# Patient Record
Sex: Male | Born: 1990 | Race: Black or African American | Hispanic: No | Marital: Single | Smoking: Current every day smoker
Health system: Southern US, Community
[De-identification: ages and names within clinical notes are randomized; demographics above are authoritative.]

## PROBLEM LIST (undated history)

## (undated) DIAGNOSIS — B20 Human immunodeficiency virus [HIV] disease: Secondary | ICD-10-CM

## (undated) DIAGNOSIS — Z21 Asymptomatic human immunodeficiency virus [HIV] infection status: Secondary | ICD-10-CM

---
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
## 2009-03-04 ENCOUNTER — Emergency Department: Payer: Self-pay

## 2012-01-19 ENCOUNTER — Emergency Department: Payer: Self-pay | Admitting: Emergency Medicine

## 2012-03-27 ENCOUNTER — Emergency Department: Payer: Self-pay | Admitting: Emergency Medicine

## 2013-05-09 ENCOUNTER — Emergency Department: Payer: Self-pay | Admitting: Emergency Medicine

## 2015-06-13 ENCOUNTER — Emergency Department
Admission: EM | Admit: 2015-06-13 | Discharge: 2015-06-13 | Disposition: A | Discharge status: Home / Self Care | Payer: Self-pay | Attending: Emergency Medicine | Admitting: Emergency Medicine

## 2015-06-13 ENCOUNTER — Encounter: Payer: Self-pay | Admitting: Emergency Medicine

## 2015-06-13 DIAGNOSIS — J039 Acute tonsillitis, unspecified: Secondary | ICD-10-CM | POA: Insufficient documentation

## 2015-06-13 DIAGNOSIS — F17200 Nicotine dependence, unspecified, uncomplicated: Secondary | ICD-10-CM | POA: Insufficient documentation

## 2015-06-13 LAB — POCT RAPID STREP A: Streptococcus, Group A Screen (Direct): NEGATIVE

## 2015-06-13 MED ORDER — DEXAMETHASONE SODIUM PHOSPHATE 10 MG/ML IJ SOLN
10.0000 mg | Freq: Once | INTRAMUSCULAR | Status: AC
  Administered 2015-06-13: 10 mg via INTRAMUSCULAR
  Filled 2015-06-13: qty 1

## 2015-06-13 MED ORDER — MAGIC MOUTHWASH W/LIDOCAINE: 5.0000 mL | Freq: Four times a day (QID) | ORAL | Status: DC | PRN

## 2015-06-13 MED ORDER — PENICILLIN G BENZATHINE 1200000 UNIT/2ML IM SUSP
2.4000 10*6.[IU] | Freq: Once | INTRAMUSCULAR | Status: AC
  Administered 2015-06-13: 2.4 10*6.[IU] via INTRAMUSCULAR
  Filled 2015-06-13: qty 4

## 2015-06-13 NOTE — ED Provider Notes (Signed)
[facility] Emergency Department Provider Note  ____________________________________________  Time seen: Approximately 9:33 PM  I have reviewed the triage vital signs and the nursing notes.   HISTORY  Chief Complaint Sore Throat    HPI Leonard Moon is a 25 y.o. male , NAD, presents to the emergency department with a several-day history of sore throat. States he's had strep throats in the past and this feels similar. States it hurts to swallow. Has taken some Tylenol earlier today with no alleviation of pain. Denies any sick contacts. Has not had any fevers, chills, body aches. Does feel fatigued. Denies any nasal congestion, runny nose, sneezing, ear pain, chest pain, shortness breath, abdominal pain, nausea, vomiting, rash.   History reviewed. No pertinent past medical history.  There are no active problems to display for this patient.   History reviewed. No pertinent past surgical history.  Current Outpatient Rx  Name  Route  Sig  Dispense  Refill  . magic mouthwash w/lidocaine SOLN   Oral   Take 5 mLs by mouth 4 (four) times daily as needed for mouth pain.   240 mL   0     Please mix 80mL diphenhydramine, 80mL nystatin, 80 ...     Allergies Review of patient's allergies indicates no known allergies.  History reviewed. No pertinent family history.  Social History Social History  Substance Use Topics  . Smoking status: Heavy Tobacco Smoker  . Smokeless tobacco: None  . Alcohol Use: Yes     Review of Systems  Constitutional: Positive fatigue. No fever/chills Eyes: No visual changes. No discharge, redness, swelling ENT: Positive sore throat. No nasal congestion, runny nose, ear pain, sinus pressure. Cardiovascular: No chest pain. Respiratory: No cough or chest congestion. No shortness of breath. No wheezing.  Gastrointestinal: No abdominal pain.  No nausea, vomiting.  No diarrhea.  Musculoskeletal: Negative for general myalgias.   Skin: Negative for rash. Neurological: Negative for headaches, focal weakness or numbness. 10-point ROS otherwise negative.  ____________________________________________   PHYSICAL EXAM:  VITAL SIGNS: ED Triage Vitals  Enc Vitals Group     BP 06/13/15 2104 141/72 mmHg     Pulse Rate 06/13/15 2104 71     Resp 06/13/15 2104 18     Temp 06/13/15 2104 98.6 F (37 C)     Temp Source 06/13/15 2104 Oral     SpO2 06/13/15 2104 99 %     Weight 06/13/15 2104 165 lb (74.844 kg)     Height 06/13/15 2104  (1.676 m)     Head Cir --      Peak Flow --      Pain Score 06/13/15 2105 8     Pain Loc --      Pain Edu? --      Excl. in GC? --      Constitutional: Alert and oriented. Well appearing and in no acute distress. Eyes: Conjunctivae are normal. PERRL. EOMI without pain.  Head: Atraumatic. ENT:      Ears: TMs visualized bilaterally without effusion, erythema, bulging, perforation.      Nose: No congestion/rhinnorhea.      Mouth/Throat: Pharynx with moderate erythema and tonsils with yellow exudate and mild swelling. No posterior pharyngeal swelling. Uvula is midline. Mucous membranes are moist.  Neck: No stridor. Supple with full range of motion. Hematological/Lymphatic/Immunilogical: No cervical lymphadenopathy. Cardiovascular: Normal rate, regular rhythm. Normal S1 and S2.  Good peripheral circulation. Respiratory: Normal respiratory effort without tachypnea or retractions. Lungs CTAB  with breath sounds noted in all lung fields. Neurologic:  Normal speech and language. No gross focal neurologic deficits are appreciated.  Skin:  Skin is warm, dry and intact. No rash noted. Psychiatric: Mood and affect are normal. Speech and behavior are normal. Patient exhibits appropriate insight and judgement.   ____________________________________________   LABS (all labs ordered are listed, but only abnormal results are displayed)  Labs Reviewed  CULTURE, GROUP A STREP [facility])  POCT  RAPID STREP A   ____________________________________________  EKG  None ____________________________________________  RADIOLOGY  None ____________________________________________    PROCEDURES  Procedure(s) performed: None    Medications  penicillin g benzathine (BICILLIN LA) 1200000 UNIT/2ML injection 2.4 Million Units (2.4 Million Units Intramuscular Given 06/13/15 2217)  dexamethasone (DECADRON) injection 10 mg (10 mg Intramuscular Given 06/13/15 2218)     ____________________________________________   INITIAL IMPRESSION / ASSESSMENT AND PLAN / ED COURSE  Pertinent lab results that were available during my care of the patient were reviewed by me and considered in my medical decision making (see chart for details).  Patient's diagnosis is consistent with acute tonsillitis. Patient was treated with 2.4 million units of Bicillin LA and 10 mg of Decadron IM. Patient will be discharged home with instructions for symptomatic care at home. Patient [facility] to [facility] if symptoms persist past this treatment course. Patient is given ED precautions to return to the ED for any worsening or new symptoms.    ____________________________________________  FINAL CLINICAL IMPRESSION(S) / ED DIAGNOSES  Final diagnoses:  Tonsillitis      NEW MEDICATIONS STARTED DURING THIS VISIT:  New Prescriptions   MAGIC MOUTHWASH W/LIDOCAINE SOLN    Take 5 mLs by mouth 4 (four) times daily as needed for mouth pain.         Hope PigeonJami L Hagler, PA-C 06/13/15 2239  Rockne MenghiniAnne-Caroline Norman, MD 06/14/15 [phone]

## 2015-06-13 NOTE — ED Notes (Signed)
Pt arrived to the ED for complaints of a sore throat and not feeling well. Pt states that he has had strep throat before and this feels like it. Pt is AOx4 in no apparent distress.

## 2015-06-13 NOTE — ED Notes (Signed)
Pt left without his discharge paper.

## 2015-06-13 NOTE — Discharge Instructions (Signed)

## 2015-06-16 LAB — CULTURE, GROUP A STREP (THRC)

## 2016-05-09 ENCOUNTER — Other Ambulatory Visit: Payer: Self-pay | Admitting: *Deleted

## 2016-05-09 DIAGNOSIS — B20 Human immunodeficiency virus [HIV] disease: Secondary | ICD-10-CM

## 2016-05-09 MED ORDER — ABACAVIR-DOLUTEGRAVIR-LAMIVUD 600-50-300 MG PO TABS: 1.0000 | ORAL_TABLET | Freq: Every day | ORAL | 5 refills | Status: DC

## 2016-05-16 ENCOUNTER — Encounter: Payer: Self-pay | Admitting: Internal Medicine

## 2016-05-21 ENCOUNTER — Ambulatory Visit (INDEPENDENT_AMBULATORY_CARE_PROVIDER_SITE_OTHER): Payer: Self-pay | Admitting: Internal Medicine

## 2016-05-21 ENCOUNTER — Encounter: Payer: Self-pay | Admitting: Internal Medicine

## 2016-05-21 DIAGNOSIS — Z7185 Encounter for immunization safety counseling: Secondary | ICD-10-CM

## 2016-05-21 DIAGNOSIS — Z79899 Other long term (current) drug therapy: Secondary | ICD-10-CM

## 2016-05-21 DIAGNOSIS — Z7189 Other specified counseling: Secondary | ICD-10-CM

## 2016-05-21 DIAGNOSIS — Z23 Encounter for immunization: Secondary | ICD-10-CM

## 2016-05-21 DIAGNOSIS — Z113 Encounter for screening for infections with a predominantly sexual mode of transmission: Secondary | ICD-10-CM

## 2016-05-21 DIAGNOSIS — B20 Human immunodeficiency virus [HIV] disease: Secondary | ICD-10-CM | POA: Insufficient documentation

## 2016-05-21 LAB — CBC WITH DIFFERENTIAL/PLATELET
BASOS PCT: 0 %
Basophils Absolute: 0 cells/uL (ref 0–200)
EOS PCT: 2 %
Eosinophils Absolute: 134 cells/uL (ref 15–500)
HEMATOCRIT: 44.9 % (ref 38.5–50.0)
HEMOGLOBIN: 14.1 g/dL (ref 13.2–17.1)
LYMPHS ABS: 1675 {cells}/uL (ref 850–3900)
LYMPHS PCT: 25 %
MCH: 30.1 pg (ref 27.0–33.0)
MCHC: 31.4 g/dL — ABNORMAL LOW (ref 32.0–36.0)
MCV: 95.9 fL (ref 80.0–100.0)
MPV: 9.2 fL (ref 7.5–12.5)
Monocytes Absolute: 603 cells/uL (ref 200–950)
Monocytes Relative: 9 %
NEUTROS PCT: 64 %
Neutro Abs: 4288 cells/uL (ref 1500–7800)
Platelets: 331 10*3/uL (ref 140–400)
RBC: 4.68 MIL/uL (ref 4.20–5.80)
RDW: 14.2 % (ref 11.0–15.0)
WBC: 6.7 10*3/uL (ref 3.8–10.8)

## 2016-05-21 LAB — COMPLETE METABOLIC PANEL WITH GFR
ALBUMIN: 4.5 g/dL (ref 3.6–5.1)
ALK PHOS: 73 U/L (ref 40–115)
ALT: 11 U/L (ref 9–46)
AST: 15 U/L (ref 10–40)
BUN: 15 mg/dL (ref 7–25)
CALCIUM: 9.4 mg/dL (ref 8.6–10.3)
CO2: 19 mmol/L — ABNORMAL LOW (ref 20–31)
Chloride: 105 mmol/L (ref 98–110)
Creat: 1.38 mg/dL — ABNORMAL HIGH (ref 0.60–1.35)
GFR, EST AFRICAN AMERICAN: 80 mL/min (ref >=60)
GFR, Est Non African American: 69 mL/min (ref >=60)
Glucose, Bld: 93 mg/dL (ref 65–99)
Potassium: 4.2 mmol/L (ref 3.5–5.3)
Sodium: 141 mmol/L (ref 135–146)
Total Bilirubin: 0.3 mg/dL (ref 0.2–1.2)
Total Protein: 7 g/dL (ref 6.1–8.1)

## 2016-05-21 LAB — LIPID PANEL
CHOL/HDL RATIO: 3.8 ratio (ref <5.0)
Cholesterol: 130 mg/dL (ref <200)
HDL: 34 mg/dL — AB (ref >40)
LDL CALC: 78 mg/dL (ref <100)
TRIGLYCERIDES: 90 mg/dL (ref <150)
VLDL: 18 mg/dL (ref <30)

## 2016-05-21 LAB — HEPATITIS B SURFACE ANTIBODY,QUALITATIVE: Hep B S Ab: POSITIVE — AB

## 2016-05-21 LAB — HEPATITIS A ANTIBODY, TOTAL: Hep A Total Ab: REACTIVE — AB

## 2016-05-21 LAB — HEPATITIS B SURFACE ANTIGEN: HEP B S AG: NEGATIVE

## 2016-05-21 LAB — HEPATITIS B CORE ANTIBODY, TOTAL: Hep B Core Total Ab: NONREACTIVE

## 2016-05-21 MED ORDER — ABACAVIR-DOLUTEGRAVIR-LAMIVUD 600-50-300 MG PO TABS: 1.0000 | ORAL_TABLET | Freq: Every day | ORAL | 11 refills | Status: AC
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
## 2016-05-21 NOTE — Addendum Note (Signed)
Addended by: Wendall Mola A on: 05/21/2016 09:49 AM   Modules accepted: Orders

## 2016-05-22 LAB — RPR

## 2016-05-23 LAB — T-HELPER CELL (CD4) - (RCID CLINIC ONLY)
CD4 % Helper T Cell: 38 % (ref 33–55)
CD4 T CELL ABS: 710 /uL (ref 400–2700)

## 2016-05-23 LAB — HIV-1 RNA,QN PCR W/REFLEX GENOTYPE

## 2016-05-28 LAB — HLA B*5701: HLA-B*5701 w/rflx HLA-B High: NEGATIVE

## 2016-06-24 ENCOUNTER — Encounter: Payer: Self-pay | Admitting: Licensed Clinical Social Worker

## 2016-08-28 ENCOUNTER — Other Ambulatory Visit: Payer: Self-pay

## 2016-09-10 ENCOUNTER — Encounter: Payer: Self-pay | Admitting: Internal Medicine

## 2016-09-10 ENCOUNTER — Ambulatory Visit (INDEPENDENT_AMBULATORY_CARE_PROVIDER_SITE_OTHER): Payer: Self-pay | Admitting: Internal Medicine

## 2016-09-10 VITALS — BP 117/75 | HR 50 | Temp 97.7°F | Ht 65.0 in | Wt 171.0 lb

## 2016-09-10 DIAGNOSIS — B20 Human immunodeficiency virus [HIV] disease: Secondary | ICD-10-CM

## 2016-09-10 DIAGNOSIS — Z113 Encounter for screening for infections with a predominantly sexual mode of transmission: Secondary | ICD-10-CM

## 2016-09-10 NOTE — Progress Notes (Signed)
   Subjective:    Patient ID: Leonard Moon, male    DOB: September 30, 1990, 26 y.o.   MRN: [id number]  HPI Here for follow up of HIV He was diagnosed in prison and started on Triumeq.  I saw him once in May after release from prison and he was doing well with a suppressed viral load.  Endorses 2 missed doses since last visit.  No associated n/v/d.  Here with the bridge counselor.  No other new complaints.     Review of Systems  Constitutional: Negative for fatigue.  Skin: Negative for rash.  Neurological: Negative for dizziness.       Objective:   Physical Exam  Constitutional: He appears well-developed and well-nourished. No distress.  HENT:  Mouth/Throat: No oropharyngeal exudate.  Eyes: No scleral icterus.  Cardiovascular: Normal rate, regular rhythm and normal heart sounds.   No murmur heard. Pulmonary/Chest: Effort normal and breath sounds normal. No respiratory distress.  Lymphadenopathy:    He has no cervical adenopathy.  Skin: No rash noted.   SH: + tobacco       Assessment & Plan:

## 2016-09-10 NOTE — Assessment & Plan Note (Signed)
Doing well.  Labs today and rtc 4 months.  

## 2016-09-10 NOTE — Assessment & Plan Note (Signed)
Uses condoms and offered today.

## 2016-09-11 LAB — T-HELPER CELL (CD4) - (RCID CLINIC ONLY)
CD4 T CELL ABS: 750 /uL (ref 400–2700)
CD4 T CELL HELPER: 34 % (ref 33–55)

## 2016-09-13 ENCOUNTER — Encounter: Payer: Self-pay | Admitting: Internal Medicine

## 2016-09-13 LAB — HIV-1 RNA QUANT-NO REFLEX-BLD
HIV 1 RNA Quant: <20 copies/mL — AB
HIV-1 RNA Quant, Log: <1.3 Log copies/mL — AB

## 2016-10-19 ENCOUNTER — Encounter: Payer: Self-pay | Admitting: Emergency Medicine

## 2016-10-19 DIAGNOSIS — Y929 Unspecified place or not applicable: Secondary | ICD-10-CM | POA: Insufficient documentation

## 2016-10-19 DIAGNOSIS — Z79899 Other long term (current) drug therapy: Secondary | ICD-10-CM | POA: Insufficient documentation

## 2016-10-19 DIAGNOSIS — Z23 Encounter for immunization: Secondary | ICD-10-CM | POA: Insufficient documentation

## 2016-10-19 DIAGNOSIS — Y990 Civilian activity done for income or pay: Secondary | ICD-10-CM | POA: Insufficient documentation

## 2016-10-19 DIAGNOSIS — B20 Human immunodeficiency virus [HIV] disease: Secondary | ICD-10-CM | POA: Insufficient documentation

## 2016-10-19 DIAGNOSIS — S41111A Laceration without foreign body of right upper arm, initial encounter: Secondary | ICD-10-CM | POA: Insufficient documentation

## 2016-10-19 DIAGNOSIS — F17210 Nicotine dependence, cigarettes, uncomplicated: Secondary | ICD-10-CM | POA: Insufficient documentation

## 2016-10-19 DIAGNOSIS — S46911A Strain of unspecified muscle, fascia and tendon at shoulder and upper arm level, right arm, initial encounter: Secondary | ICD-10-CM | POA: Insufficient documentation

## 2016-10-19 DIAGNOSIS — Y939 Activity, unspecified: Secondary | ICD-10-CM | POA: Insufficient documentation
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
## 2016-10-20 ENCOUNTER — Emergency Department: Payer: Self-pay

## 2016-10-20 ENCOUNTER — Emergency Department
Admission: EM | Admit: 2016-10-20 | Discharge: 2016-10-20 | Disposition: A | Discharge status: Home / Self Care | Payer: Self-pay | Attending: Emergency Medicine | Admitting: Emergency Medicine

## 2016-10-20 ENCOUNTER — Encounter: Payer: Self-pay | Admitting: Emergency Medicine

## 2016-10-20 DIAGNOSIS — M25511 Pain in right shoulder: Secondary | ICD-10-CM

## 2016-10-20 DIAGNOSIS — S46911A Strain of unspecified muscle, fascia and tendon at shoulder and upper arm level, right arm, initial encounter: Secondary | ICD-10-CM

## 2016-10-20 DIAGNOSIS — S41111A Laceration without foreign body of right upper arm, initial encounter: Secondary | ICD-10-CM

## 2016-10-20 HISTORY — DX: Asymptomatic human immunodeficiency virus (hiv) infection status: Z21

## 2016-10-20 HISTORY — DX: Human immunodeficiency virus (HIV) disease: B20

## 2016-10-20 MED ORDER — HYDROCODONE-ACETAMINOPHEN 5-325 MG PO TABS: 1.0000 | ORAL_TABLET | ORAL | 0 refills | Status: DC | PRN

## 2016-10-20 MED ORDER — OXYCODONE-ACETAMINOPHEN 5-325 MG PO TABS
ORAL_TABLET | ORAL | Status: AC
  Filled 2016-10-20: qty 1

## 2016-10-20 MED ORDER — OXYCODONE-ACETAMINOPHEN 5-325 MG PO TABS
2.0000 | ORAL_TABLET | Freq: Once | ORAL | Status: AC
  Administered 2016-10-20: 2 via ORAL
  Filled 2016-10-20: qty 2

## 2016-10-20 MED ORDER — TETANUS-DIPHTH-ACELL PERTUSSIS 5-2.5-18.5 LF-MCG/0.5 IM SUSP
0.5000 mL | Freq: Once | INTRAMUSCULAR | Status: AC
  Administered 2016-10-20: 0.5 mL via INTRAMUSCULAR
  Filled 2016-10-20: qty 0.5
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
## 2016-10-20 NOTE — Discharge Instructions (Signed)
We believe that your right shoulder/arm pain are caused by musculoskeletal strain.  Please read through the included information about additional care such as heating pads, over-the-counter pain medicine.  If you were provided a prescription please use it only as needed and as instructed.  Remember that early mobility and using the affected part of your body is actually better than keeping it immobile.  Use the sling only as needed. Please call to schedule a follow up appointment with orthopedics.  The laceration on the back of your right upper arm was repaired with skin adhesive and sterile tape.  Please do not soak the wound in water and try to keep it as dry as possible.  The tape and glue will fall off on their own in a week or more - please do not pull it off early.  If the tape starts to peel off at the edges, please trim them carefully with scissors, but do not remove the entire strip of tape until it falls off on its own.    Return to the emergency department if you develop new or worsening symptoms that concern you.  Take Norco as prescribed for severe pain. Do not drink alcohol, drive or participate in any other potentially dangerous activities while taking this medication as it may make you sleepy. Do not take this medication with any other sedating medications, either prescription or over-the-counter. If you were prescribed Percocet or Vicodin, do not take these with acetaminophen (Tylenol) as it is already contained within these medications.   This medication is an opiate (or narcotic) pain medication and can be habit forming.  Use it as little as possible to achieve adequate pain control.  Do not use or use it with extreme caution if you have a history of opiate abuse or dependence.  If you are on a pain contract with your primary care doctor or a pain specialist, be sure to let them know you were prescribed this medication today from the [facility] Emergency Department.  This medication  is intended for your use only - do not give any to anyone else and keep it in a secure place where nobody else, especially children, have access to it.  It will also cause or worsen constipation, so you may want to consider taking an over-the-counter stool softener while you are taking this medication.   Follow-up with the doctor listed as recommended or return to the emergency department with new or worsening symptoms that concern you.

## 2016-11-11 ENCOUNTER — Emergency Department
Admission: EM | Admit: 2016-11-11 | Discharge: 2016-11-11 | Disposition: A | Discharge status: Home / Self Care | Payer: Self-pay | Attending: Emergency Medicine | Admitting: Emergency Medicine

## 2016-11-11 ENCOUNTER — Encounter: Payer: Self-pay | Admitting: Emergency Medicine

## 2016-11-11 DIAGNOSIS — F17210 Nicotine dependence, cigarettes, uncomplicated: Secondary | ICD-10-CM | POA: Insufficient documentation

## 2016-11-11 DIAGNOSIS — M542 Cervicalgia: Secondary | ICD-10-CM | POA: Insufficient documentation

## 2016-11-11 DIAGNOSIS — B20 Human immunodeficiency virus [HIV] disease: Secondary | ICD-10-CM | POA: Insufficient documentation

## 2016-11-11 DIAGNOSIS — M898X1 Other specified disorders of bone, shoulder: Secondary | ICD-10-CM
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
## 2017-01-09 ENCOUNTER — Other Ambulatory Visit: Payer: Self-pay

## 2017-01-23 ENCOUNTER — Encounter: Payer: Self-pay | Admitting: Internal Medicine

## 2017-01-23 ENCOUNTER — Ambulatory Visit (INDEPENDENT_AMBULATORY_CARE_PROVIDER_SITE_OTHER): Payer: Self-pay | Admitting: Internal Medicine

## 2017-01-23 VITALS — BP 103/68 | HR 67 | Temp 98.5°F | Ht 66.0 in | Wt 171.0 lb

## 2017-01-23 DIAGNOSIS — Z7185 Encounter for immunization safety counseling: Secondary | ICD-10-CM

## 2017-01-23 DIAGNOSIS — B20 Human immunodeficiency virus [HIV] disease: Secondary | ICD-10-CM

## 2017-01-23 DIAGNOSIS — Z23 Encounter for immunization: Secondary | ICD-10-CM

## 2017-01-23 DIAGNOSIS — Z720 Tobacco use: Secondary | ICD-10-CM

## 2017-01-23 DIAGNOSIS — Z7189 Other specified counseling: Secondary | ICD-10-CM
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
## 2017-01-23 NOTE — Assessment & Plan Note (Signed)
Back on and hopefully no resistance.  If concerns, will recheck in 4 weeks, otherwise RTC 4 months.

## 2017-01-23 NOTE — Assessment & Plan Note (Signed)
Counseled on cessation 

## 2017-01-23 NOTE — Assessment & Plan Note (Signed)
Counseled on flu shot and given today 

## 2017-01-24 LAB — T-HELPER CELL (CD4) - (RCID CLINIC ONLY)
CD4 T CELL ABS: 450 /uL (ref 400–2700)
CD4 T CELL HELPER: 25 % — AB (ref 33–55)

## 2017-01-27 ENCOUNTER — Telehealth: Payer: Self-pay | Admitting: *Deleted

## 2017-01-27 ENCOUNTER — Other Ambulatory Visit: Payer: Self-pay | Admitting: Internal Medicine

## 2017-01-27 DIAGNOSIS — B20 Human immunodeficiency virus [HIV] disease: Secondary | ICD-10-CM

## 2017-01-27 NOTE — Telephone Encounter (Signed)
No answer and no voice mail set up. Per Dr. Raliegh Ipomer Karen in lab notified to add genotype. Wendall MolaJacqueline Cockerham

## 2017-01-27 NOTE — Telephone Encounter (Signed)
-----   Message from Gardiner Barefootobert W Comer, MD sent at 01/27/2017  1:13 PM EST ----- He told me he was back on his Triumeq about 1 month prior to the visit when he got his lab.  Can you confirm with him if that is true, add on a genotype and get him back in with Pharm D next week (after results available);  if not true and he was just back on a short time, just recheck the viral load next week.  thanks

## 2017-02-05 ENCOUNTER — Telehealth: Payer: Self-pay | Admitting: *Deleted

## 2017-02-05 NOTE — Telephone Encounter (Signed)
Unable to contact patient, voice mail box not set up so that a message could not be left.  RN will try again 02/06/17.

## 2017-02-05 NOTE — Telephone Encounter (Signed)
-----   Message from Gardiner Barefootobert W Comer, MD sent at 02/05/2017  4:42 PM EST ----- This patient has detectable virus despite reported compliance.  I have add on a genotype and Annice PihJackie reached out to him last week but I don't think he has been reached.  He needs to get in with someone next week to follow up geno and repeat HIV rna. thanks

## 2017-02-06 NOTE — Telephone Encounter (Signed)
Unable to contact the patient via listed phone#.  Second attempt.

## 2017-02-14 LAB — TEST AUTHORIZATION

## 2017-02-14 LAB — HIV-1 GENOTYPE: HIV-1 Genotype: DETECTED — AB

## 2017-02-14 LAB — HIV-1 RNA QUANT-NO REFLEX-BLD
HIV 1 RNA Quant: 31900 copies/mL — ABNORMAL HIGH
HIV-1 RNA QUANT, LOG: 4.5 {Log_copies}/mL — AB

## 2017-02-17 ENCOUNTER — Telehealth: Payer: Self-pay

## 2017-02-17 DIAGNOSIS — B20 Human immunodeficiency virus [HIV] disease: Secondary | ICD-10-CM

## 2017-02-17 NOTE — Telephone Encounter (Signed)
-----   Message from Troy Sineenise D Estridge, RN sent at 02/06/2017  5:12 PM EST ----- Regarding: Per Dr. Luciana Axeomer patient needs follow-up lab work  His voice mail is not set up.  He works at [employer].  May be able to reach him in the AM.  Thanks,  Angelique Blonderenise

## 2017-02-17 NOTE — Telephone Encounter (Signed)
Not able to reach patient via phone.  Will refer to Leonard Moon.   Laurell Josephsammy K Benna Arno, RN

## 2017-02-18 NOTE — Telephone Encounter (Signed)
Happy to try and engage with him. Hoping I can reach him this morning. Thanks

## 2017-03-03 ENCOUNTER — Telehealth: Payer: Self-pay | Admitting: *Deleted

## 2017-03-03 NOTE — Telephone Encounter (Signed)
Referral received from Tammy/Dr Comer asking me to engage with Mr. Leonard Moon. I have tried to reach him in the past but did not get an answer and VM has not been set up. Called today and did not receive an answer but I immediately received a text message back stating "who this" .  I replied stating "My name is Leonard Moon and I am a nurse just trying to check on Mr. Leonard Moon.   I received a reply that said "He will be back at 5"  I replied stating "ok. Everything is fine but our dept just wanted to check in with him. Would you have him call or text me please?"   I did not receive a reply back.

## 2017-05-27 ENCOUNTER — Ambulatory Visit: Payer: Self-pay | Admitting: Internal Medicine
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
## 2020-01-07 ENCOUNTER — Ambulatory Visit: Payer: Self-pay | Admitting: Infectious Diseases

## 2020-01-07 NOTE — Progress Notes (Deleted)
   Name: Leonard Moon  DOB: 1990-07-17 MRN: [id number] PCP: Patient, No Pcp Per    Patient Active Problem List   Diagnosis Date Noted  . Tobacco abuse 01/23/2017  . HIV disease ([place]) 05/21/2016  . Vaccine counseling 05/21/2016  . Screening examination for venereal disease 05/21/2016  . Encounter for long-term (current) use of high-risk medication 05/21/2016     Brief Narrative:  Leonard Moon is a 29 y.o. male  with HIV disease, Dx 2017 during incarceration.  CD4 nadir *** HIV Risk: heterosexual  History of OIs: none Intake Labs ***: Hep B sAg (***), sAb (***), cAb (***); Hep A (***), Hep C (***) Quantiferon (***) HLA B*5701 (***) G6PD: (***)   Previous Regimens: . Triumeq 2018   Genotypes: . ***  Subjective:  No chief complaint on file.    HPI: Leonard Moon is here for follow up to restart HIV treatment. Last office visit with Dr. Linus Salmons was 2019. Was on Triumeq once daily at that time. Since this visit he ***.     ROS  Past Medical History:  Diagnosis Date  . HIV (human immunodeficiency virus infection) ([place])     Outpatient Medications Prior to Visit  Medication Sig Dispense Refill  . abacavir-dolutegravir-lamiVUDine (TRIUMEQ) 600-50-300 MG tablet Take 1 tablet by mouth daily. 30 tablet 11   No facility-administered medications prior to visit.     No Known Allergies  Social History   Tobacco Use  . Smoking status: Current Every Day Smoker    Packs/day: 0.50    Types: Cigarettes    Start date: 01/21/2006  . Smokeless tobacco: Never Used  Substance Use Topics  . Alcohol use: Yes  . Drug use: No    No family history on file.  Social History   Substance and Sexual Activity  Sexual Activity Not on file     Objective:  There were no vitals filed for this visit. There is no height or weight on file to calculate BMI.  Physical Exam  Lab Results Lab Results  Component Value Date   WBC 6.7 05/21/2016   HGB 14.1 05/21/2016   HCT 44.9  05/21/2016   MCV 95.9 05/21/2016   PLT 331 05/21/2016    Lab Results  Component Value Date   CREATININE 1.38 (H) 05/21/2016   BUN 15 05/21/2016   NA 141 05/21/2016   K 4.2 05/21/2016   CL 105 05/21/2016   CO2 19 (L) 05/21/2016    Lab Results  Component Value Date   ALT 11 05/21/2016   AST 15 05/21/2016   ALKPHOS 73 05/21/2016   BILITOT 0.3 05/21/2016    Lab Results  Component Value Date   CHOL 130 05/21/2016   HDL 34 (L) 05/21/2016   LDLCALC 78 05/21/2016   TRIG 90 05/21/2016   CHOLHDL 3.8 05/21/2016   HIV 1 RNA Quant (copies/mL)  Date Value  01/23/2017 31,900 (H)  09/10/2016 <20 DETECTED (A)   CD4 T Cell Abs (/uL)  Date Value  01/23/2017 450  09/10/2016 750  05/21/2016 710     Assessment & Plan:   Problem List Items Addressed This Visit   None     Leonard Madeira, MSN, NP-C [facility]  01/07/20  8:34 AM

## 2020-03-03 ENCOUNTER — Ambulatory Visit: Payer: Self-pay | Admitting: Internal Medicine

## 2020-03-03 ENCOUNTER — Telehealth: Payer: Self-pay

## 2020-03-03 NOTE — Telephone Encounter (Signed)
Attempted to contact patient to confirm office visit or offer phone visit with need.  Wrong phone number listed in epic as identifying voicemail states another name. Leonard Moon

## 2020-07-06 ENCOUNTER — Emergency Department
Admission: EM | Admit: 2020-07-06 | Discharge: 2020-07-06 | Disposition: A | Discharge status: Home / Self Care | Payer: Self-pay | Attending: Emergency Medicine | Admitting: Emergency Medicine

## 2020-07-06 ENCOUNTER — Other Ambulatory Visit: Payer: Self-pay

## 2020-07-06 DIAGNOSIS — Y990 Civilian activity done for income or pay: Secondary | ICD-10-CM | POA: Insufficient documentation

## 2020-07-06 DIAGNOSIS — F17210 Nicotine dependence, cigarettes, uncomplicated: Secondary | ICD-10-CM | POA: Insufficient documentation

## 2020-07-06 DIAGNOSIS — X58XXXA Exposure to other specified factors, initial encounter: Secondary | ICD-10-CM | POA: Insufficient documentation

## 2020-07-06 DIAGNOSIS — S39012A Strain of muscle, fascia and tendon of lower back, initial encounter: Secondary | ICD-10-CM | POA: Insufficient documentation

## 2020-07-06 DIAGNOSIS — Z21 Asymptomatic human immunodeficiency virus [HIV] infection status: Secondary | ICD-10-CM | POA: Insufficient documentation

## 2020-07-06 NOTE — ED Notes (Signed)
See triage note  Presents with lower back pain  States he does a lot of lifting and bending  Denies any specific injury  Pain is relieved with IBU  Ambulates well to treatment area

## 2020-07-06 NOTE — ED Provider Notes (Signed)
[facility] Emergency Department Provider Note   ____________________________________________   Event Date/Time   First MD Initiated Contact with Patient 07/06/20 1148     (approximate)  I have reviewed the triage vital signs and the nursing notes.   HISTORY  Chief Complaint Back Pain    HPI Leonard Moon is a 30 y.o. male patient complaining of low back pain that occurred at work today.  Denies bladder or bowel dysfunction.  Denies radicular component to pain.  Rates pain as a 2/10.  Described pain as "achy".  Patient state he left work and took over-the-counter medications with moderate relief.  Patient notes required to return to work.         Past Medical History:  Diagnosis Date   HIV (human immunodeficiency virus infection) [facility])     Patient Active Problem List   Diagnosis Date Noted   Tobacco abuse 01/23/2017   HIV disease (HCC) 05/21/2016   Vaccine counseling 05/21/2016   Screening examination for venereal disease 05/21/2016   Encounter for long-term (current) use of high-risk medication 05/21/2016    History reviewed. No pertinent surgical history.  Prior to Admission medications   Medication Sig Start Date End Date Taking? Authorizing Provider  abacavir-dolutegravir-lamiVUDine (TRIUMEQ) 600-50-300 MG tablet Take 1 tablet by mouth daily. 05/21/16   Comer, Belia Heman, MD    Allergies Patient has no known allergies.  No family history on file.  Social History Social History   Tobacco Use   Smoking status: Every Day    Packs/day: 0.50    Pack years: 0.00    Types: Cigarettes    Start date: 01/21/2006   Smokeless tobacco: Never  Substance Use Topics   Alcohol use: Yes   Drug use: No    Review of Systems  Constitutional: No fever/chills Eyes: No visual changes. ENT: No sore throat. Cardiovascular: Denies chest pain. Respiratory: Denies shortness of breath. Gastrointestinal: No abdominal pain.  No nausea, no vomiting.   No diarrhea.  No constipation. Genitourinary: Negative for dysuria. Musculoskeletal: Positive for back pain. Skin: Negative for rash. Allergic/Immunilogical: HIV  ____________________________________________   PHYSICAL EXAM:  VITAL SIGNS: ED Triage Vitals  Enc Vitals Group     BP 07/06/20 1129 138/78     Pulse Rate 07/06/20 1129 80     Resp 07/06/20 1129 16     Temp 07/06/20 1129 98.6 F (37 C)     Temp Source 07/06/20 1129 Oral     SpO2 07/06/20 1129 99 %     Weight 07/06/20 1126 165 lb (74.8 kg)     Height 07/06/20 1126 5\' 4"  (1.626 m)     Head Circumference --      Peak Flow --      Pain Score 07/06/20 1126 2     Pain Loc --      Pain Edu? --      Excl. in GC? --     Constitutional: Alert and oriented. Well appearing and in no acute distress. Cardiovascular: Normal rate, regular rhythm. Grossly normal heart sounds.  Good peripheral circulation. Respiratory: Normal respiratory effort.  No retractions. Lungs CTAB. Gastrointestinal: Soft and nontender. No distention. No abdominal bruits. No CVA tenderness. Genitourinary: Deferred Musculoskeletal: No obvious spinal deformity.  Patient has full and equal range of motion of the lumbar spine.  No lower extremity tenderness nor edema.  No joint effusions. Neurologic:  Normal speech and language. No gross focal neurologic deficits are appreciated. No gait instability. Skin:  Skin is warm, dry and intact. No rash noted. Psychiatric: Mood and affect are normal. Speech and behavior are normal.  ____________________________________________   LABS (all labs ordered are listed, but only abnormal results are displayed)  Labs Reviewed - No data to display ____________________________________________  EKG   ____________________________________________  RADIOLOGY I, Joni Reining, personally viewed and evaluated these images (plain radiographs) as part of my medical decision making, as well as reviewing the written report by  the radiologist.  ED MD interpretation:    Official radiology report(s): No results found.  ____________________________________________   PROCEDURES  Procedure(s) performed (including Critical Care):  Procedures   ____________________________________________   INITIAL IMPRESSION / ASSESSMENT AND PLAN / ED COURSE  As part of my medical decision making, I reviewed the following data within the electronic MEDICAL RECORD NUMBER         Patient presents with low back pain which is resolved secondary to over-the-counter anti-inflammatory medications.  Patient requests return to work note.  ____________________________________________   FINAL CLINICAL IMPRESSION(S) / ED DIAGNOSES  Final diagnoses:  Strain of lumbar region, initial encounter     ED Discharge Orders     None        Note:  This document was prepared using Dragon voice recognition software and may include unintentional dictation errors.    Joni Reining, PA-C 07/06/20 1219    Chesley Noon, MD 07/06/20 [phone]

## 2020-07-06 NOTE — ED Triage Notes (Signed)
Pt to ED POV for "hurting back", states he feels better after taking advil just needs a note for work. ambulatory

## 2020-07-06 NOTE — Discharge Instructions (Signed)
Follow discharge care instructions. 

## 2021-07-28 ENCOUNTER — Other Ambulatory Visit: Payer: Self-pay

## 2021-07-28 ENCOUNTER — Emergency Department
Admission: EM | Admit: 2021-07-28 | Discharge: 2021-07-28 | Disposition: A | Discharge status: Home / Self Care | Payer: Self-pay | Attending: Emergency Medicine | Admitting: Emergency Medicine

## 2021-07-28 DIAGNOSIS — J029 Acute pharyngitis, unspecified: Secondary | ICD-10-CM | POA: Insufficient documentation

## 2021-07-28 DIAGNOSIS — Z21 Asymptomatic human immunodeficiency virus [HIV] infection status: Secondary | ICD-10-CM | POA: Insufficient documentation

## 2021-07-28 DIAGNOSIS — F17210 Nicotine dependence, cigarettes, uncomplicated: Secondary | ICD-10-CM | POA: Insufficient documentation

## 2021-07-28 LAB — GROUP A STREP BY PCR: Group A Strep by PCR: NOT DETECTED

## 2021-07-28 MED ORDER — AMOXICILLIN 875 MG PO TABS: 875.0000 mg | ORAL_TABLET | Freq: Two times a day (BID) | ORAL | 0 refills | Status: DC

## 2021-07-28 NOTE — ED Notes (Signed)
Throat started hurting about 3 days ago. Denies congestion. No swelling noted down side of neck. No white areas noted in throat. Took Advil last night.

## 2021-07-28 NOTE — ED Provider Notes (Signed)
[facility] Provider Note    None    (approximate)   History   Sore Throat   HPI  Leonard Moon is a 31 y.o. male complains of sore throat for the last 2 to 3 days.  Patient denies any fever.  He is unaware of any known exposure to strep but has a history of strep and tonsillitis.  Patient is a half a pack a day smoker and is HIV positive.     Physical Exam   Triage Vital Signs: ED Triage Vitals  Enc Vitals Group     BP 07/28/21 0655 123/77     Pulse Rate 07/28/21 0655 65     Resp 07/28/21 0655 18     Temp 07/28/21 0655 97.9 F (36.6 C)     Temp Source 07/28/21 0655 Oral     SpO2 07/28/21 0655 98 %     Weight 07/28/21 0655 170 lb (77.1 kg)     Height 07/28/21 0655 5\' 5"  (1.651 m)     Head Circumference --      Peak Flow --      Pain Score 07/28/21 0702 5     Pain Loc --      Pain Edu? --      Excl. in GC? --     Most recent vital signs: Vitals:   07/28/21 0655  BP: 123/77  Pulse: 65  Resp: 18  Temp: 97.9 F (36.6 C)  SpO2: 98%     General: Awake, no distress.  CV:  Good peripheral perfusion.  Heart regular rate and rhythm. Resp:  Normal effort.  Lungs are clear bilaterally. Abd:  No distention.  Other:  Posterior pharynx mildly erythematous but no exudate is noted.  Uvula is midline.  Neck is supple without lymphadenopathy.   ED Results / Procedures / Treatments   Labs (all labs ordered are listed, but only abnormal results are displayed) Labs Reviewed  GROUP A STREP BY PCR       PROCEDURES:  Critical Care performed:   Procedures   MEDICATIONS ORDERED IN ED: Medications - No data to display   IMPRESSION / MDM / ASSESSMENT AND PLAN / ED COURSE  I reviewed the triage vital signs and the nursing notes.   Differential diagnosis includes, but is not limited to, strep pharyngitis, viral pharyngitis, tonsillitis.  31 year old male presents to the ED with complaint of sore throat for the last 2 to 3 days  without known fever.  Patient states he has history of strep pharyngitis but is unaware of any known exposure.  He continues to eat and drink without difficulty.  Posterior pharynx is erythematous but no exudate or tonsillar enlargement noted.  Strep test was negative and patient was made aware.  Due to physical findings and his compromised immune status will place on amoxicillin 875 twice daily for 10 days.  Patient is strongly encouraged to follow-up with his primary care provider or Dr. 32 who is on-call for [facility].      Patient's presentation is most consistent with acute, uncomplicated illness.  FINAL CLINICAL IMPRESSION(S) / ED DIAGNOSES   Final diagnoses:  Acute pharyngitis, unspecified etiology     Rx / DC Orders   ED Discharge Orders          Ordered    amoxicillin (AMOXIL) 875 MG tablet  2 times daily        07/28/21 0751  Note:  This document was prepared using Dragon voice recognition software and may include unintentional dictation errors.   Tommi Rumps, PA-C 07/28/21 8295    Minna Antis, MD 07/28/21 [phone]

## 2021-07-28 NOTE — Discharge Instructions (Addendum)
Follow-up with your primary care provider if any continued problems.  If not improving he should call make an appointment with Dr. Willeen Cass who is the doctor on-call for ear nose and throat.  His office is located in [facility] with his contact information and address listed on your discharge papers.

## 2021-07-28 NOTE — ED Triage Notes (Signed)
Patient to ER via POV with complaints of a sore throat x3 days. Reports that it hurts to swallow. Hx of strep and tonsillitis, reports it feels the same.

## 2022-05-09 ENCOUNTER — Emergency Department
Admission: EM | Admit: 2022-05-09 | Discharge: 2022-05-09 | Disposition: A | Discharge status: Home / Self Care | Payer: Self-pay | Attending: Emergency Medicine | Admitting: Emergency Medicine

## 2022-05-09 ENCOUNTER — Other Ambulatory Visit: Payer: Self-pay

## 2022-05-09 DIAGNOSIS — X58XXXA Exposure to other specified factors, initial encounter: Secondary | ICD-10-CM | POA: Insufficient documentation

## 2022-05-09 DIAGNOSIS — S39012A Strain of muscle, fascia and tendon of lower back, initial encounter: Secondary | ICD-10-CM | POA: Insufficient documentation

## 2022-05-09 NOTE — ED Provider Notes (Signed)
[facility] Provider Note    Event Date/Time   First MD Initiated Contact with Patient 05/09/22 [phone]     (approximate)   History   Back Pain   HPI  Ramez BERTIN INABINET is a 32 y.o. male   presents to the ED with complaint of low back pain.  Patient states that he was working out last evening and possibly injured his back at that time.  He states he took ibuprofen today and pain has improved.  He states that he did not go to work today and needs a work note.  He denies any other symptoms and denies urinary symptoms.      Physical Exam   Triage Vital Signs: ED Triage Vitals  Enc Vitals Group     BP 05/09/22 0832 136/66     Pulse Rate 05/09/22 0832 60     Resp 05/09/22 0832 18     Temp 05/09/22 0832 98 F (36.7 C)     Temp src --      SpO2 05/09/22 0832 100 %     Weight --      Height --      Head Circumference --      Peak Flow --      Pain Score 05/09/22 0829 6     Pain Loc --      Pain Edu? --      Excl. in GC? --     Most recent vital signs: Vitals:   05/09/22 0832  BP: 136/66  Pulse: 60  Resp: 18  Temp: 98 F (36.7 C)  SpO2: 100%     General: Awake, no distress.  CV:  Good peripheral perfusion.  Resp:  Normal effort.  Abd:  No distention.  Other:  Examination of the back there is no gross deformity.  Patient is able to stand and ambulate without any assistance.  He is able to bend and range of motion is unrestricted at this time on exam.   ED Results / Procedures / Treatments   Labs (all labs ordered are listed, but only abnormal results are displayed) Labs Reviewed - No data to display    PROCEDURES:  Critical Care performed:   Procedures   MEDICATIONS ORDERED IN ED: Medications - No data to display   IMPRESSION / MDM / ASSESSMENT AND PLAN / ED COURSE  I reviewed the triage vital signs and the nursing notes.   Differential diagnosis includes, but is not limited to, lumbar strain, lumbar pain, muscle skeletal  pain, encounter for work note.  32 year old male presents to the ED with complaint of low back pain after working out in the gym last evening.  He states that he primarily is here to obtain a work note as he did not go and his work is requiring that he bring a doctor's note.  He has taken ibuprofen with relief of his pain.  He continues to ambulate without any assistance and denies any recent injury to his back.   Patient's presentation is most consistent with acute, uncomplicated illness.  FINAL CLINICAL IMPRESSION(S) / ED DIAGNOSES   Final diagnoses:  Low back strain, initial encounter     Rx / DC Orders   ED Discharge Orders     None        Note:  This document was prepared using Dragon voice recognition software and may include unintentional dictation errors.   Tommi Rumps, PA-C 05/09/22 1320    Jene Every, MD  05/09/22 1328  

## 2022-05-09 NOTE — Discharge Instructions (Signed)
Follow-up with your primary care provider or urgent care if any continued problems.  Continue with ibuprofen 2 to 3 tablets with food 3 times a day.  You may also use heat or ice to your back as needed.

## 2022-05-09 NOTE — ED Notes (Signed)
AVS provided to and discussed with patient. Pt verbalizes understanding of discharge instructions and denies any questions or concerns at this time. Pt ambulated out of department independently with steady gait.  

## 2022-05-09 NOTE — ED Triage Notes (Signed)
Pt comes with c/o lower back pain. Pt states he went to work but sent here to get checked out. Pt states he was working out and injured it last night.

## 2023-06-29 ENCOUNTER — Emergency Department
Admission: EM | Admit: 2023-06-29 | Discharge: 2023-06-29 | Disposition: A | Discharge status: Home / Self Care | Payer: Self-pay | Attending: Emergency Medicine | Admitting: Emergency Medicine

## 2023-06-29 DIAGNOSIS — K0889 Other specified disorders of teeth and supporting structures: Secondary | ICD-10-CM | POA: Insufficient documentation

## 2023-06-29 MED ORDER — AMOXICILLIN-POT CLAVULANATE 875-125 MG PO TABS: 1.0000 | ORAL_TABLET | Freq: Two times a day (BID) | ORAL | 0 refills | Status: AC

## 2023-06-29 NOTE — Discharge Instructions (Addendum)
 You were evaluated in the ED for tooth pain.  On physical exam there is no evidence of a dental abscess requiring incision and drainage at this time.  We have placed you on antibiotics in which you are encouraged to complete the dosage.  Please follow-up with a dentist and this has been provided for you in your discharge papers.  Continue to take Tylenol  or ibuprofen for pain as needed.  Gargle with warm salt water daily.  Practice good oral hygiene.

## 2023-06-29 NOTE — ED Provider Notes (Signed)
 [facility] Emergency Department Provider Note     Event Date/Time   First MD Initiated Contact with Patient 06/29/23 1601     (approximate)   History   Dental Pain   HPI  Leonard Moon is a 33 y.o. male presents to the ED for evaluation of a possible dental abscess to the right lower gumline adjacent to tooth #32.  Patient reports he noticed a small bump this morning.  Denies drainage.  States pain is only present when tooth is touched. He does not have a dentist at this time. Denies fever, however endorses chills.     Physical Exam   Triage Vital Signs: ED Triage Vitals [06/29/23 1558]  Encounter Vitals Group     BP (!) 142/79     Systolic BP Percentile      Diastolic BP Percentile      Pulse Rate 80     Resp 16     Temp 98.6 F (37 C)     Temp Source Oral     SpO2 97 %     Weight      Height      Head Circumference      Peak Flow      Pain Score 0     Pain Loc      Pain Education      Exclude from Growth Chart     Most recent vital signs: Vitals:   06/29/23 1558  BP: (!) 142/79  Pulse: 80  Resp: 16  Temp: 98.6 F (37 C)  SpO2: 97%   General Awake, no distress.  HEENT NCAT.  CV:  Good peripheral perfusion.  RESP:  Normal effort.  ABD:  No distention.  Other:  Lower right tooth # 32 reveals to be cracked.  Noted cavity.  Poor dentition overall.  No palpable abscess for I&D.   ED Results / Procedures / Treatments   Labs (all labs ordered are listed, but only abnormal results are displayed) Labs Reviewed - No data to display  No results found.  PROCEDURES:  Critical Care performed: No  Procedures   MEDICATIONS ORDERED IN ED: Medications - No data to display   IMPRESSION / MDM / ASSESSMENT AND PLAN / ED COURSE  I reviewed the triage vital signs and the nursing notes.                               33 y.o. male presents to the emergency department for evaluation and treatment of dental pain. See HPI for  further details.   Differential diagnosis includes, but is not limited to dental pain, abscess, infected tooth  Patient's presentation is most consistent with acute complicated illness / injury requiring diagnostic workup.  Patient is alert and oriented.  He is hemodynamically stable and afebrile.  Physical exam findings are stated above.  No palpable abscess indicating I&D at this time.  Encourage close follow-up with dentist in which a list has been provided to him for further evaluation.  Will send home on Augmentin.  Patient is stable condition for discharge home and outpatient management.  ED return precaution discussed.   FINAL CLINICAL IMPRESSION(S) / ED DIAGNOSES   Final diagnoses:  Toothache     Rx / DC Orders   ED Discharge Orders          Ordered    amoxicillin -clavulanate (AUGMENTIN) 875-125 MG tablet  2 times daily  06/29/23 1617           Note:  This document was prepared using Dragon voice recognition software and may include unintentional dictation errors.    Phyllis Breeze, Raynaldo Falco A, PA-C 06/29/23 1610    Bryson Carbine, MD 06/29/23 [phone]

## 2023-06-29 NOTE — ED Triage Notes (Signed)
 Pt c/o L lower dental pain in the area of a known cracked tooth. Pt endorses chills overnight.  NAD noted during triage.
